# Patient Record
Sex: Male | Born: 1975 | Hispanic: Yes | Marital: Married | Smoking: Never smoker
Health system: Southern US, Community
[De-identification: ages and names within clinical notes are randomized; demographics above are authoritative.]

---

## 2009-12-15 ENCOUNTER — Emergency Department: Payer: Self-pay | Admitting: Emergency Medicine

## 2009-12-17 ENCOUNTER — Emergency Department: Payer: Self-pay | Admitting: Internal Medicine

## 2011-03-17 ENCOUNTER — Emergency Department: Payer: Self-pay | Admitting: Emergency Medicine

## 2014-12-08 ENCOUNTER — Inpatient Hospital Stay: Payer: Self-pay | Admitting: Internal Medicine

## 2015-02-27 NOTE — Discharge Summary (Signed)
PATIENT NAME:  Jason BushyMUNDO VALENZUELA, Awais MR#:  [id number] DATE OF BIRTH:  1976/10/20  DATE OF ADMISSION:  12/08/2014 DATE OF DISCHARGE:  12/09/2014  PRIMARY CARE PHYSICIAN:  At the [facility].   FINAL DIAGNOSES: 2. Acute respiratory failure, which resolved.  3. Bronchitis with asthmatic component.   MEDICATIONS ON DISCHARGE: Include prednisone taper 10 mg 3 tablets day 1, 2 tablets day 2, 1 tablet day 3, 1/2 tablet days 4 and 5. Zithromax 500 mg once a day for 3 days, Ceftin 500 mg 1 tablet twice a day for 9 days, Proventil HFA 1 puff 4 times a day as needed for shortness of breath.   DIET: Regular diet, regular consistency.   ACTIVITY: As tolerated.   HISTORY OF PRESENT ILLNESS: The patient 12/08/2014 and discharged 12/09/2014, came in with shortness of breath, cough, and fever, was admitted with hypoxia and bronchitis, started on Rocephin and Zithromax, and a prednisone taper.   LABORATORY AND RADIOLOGICAL DATA DURING  HOSPITAL COURSE: Included an initial chest x-ray that showed the possibility of bronchitis. Glucose 113, BUN 13, creatinine 1.15, sodium 138, potassium 3.7, chloride 102, CO2 of 29, alkaline phosphatase 120, other liver function tests normal range. White blood cell count 13.6, hemoglobin and hematocrit 15.8 and 47.8, platelet count of 287,000. Blood cultures negative. Lactic acid 1.5. Venous pH 7.35. Influenza was negative. Hemoglobin A1c 5.3. Creatinine upon discharge 0.95. White count upon discharge 11.9. Repeat chest x-ray, PA and lateral, showed chronic bronchitic changes with new segmental atelectasis of the left lung base. No pneumonia or CHF.   HOSPITAL COURSE PER PROBLEM LIST:  1. For the patient's acute respiratory failure: This had resolved. The patient had hypoxia on presentation with ambulation. I ambulated the patient, pulse oximetry remained above 90%.  2. Bronchitis with asthmatic component: The patient was on Rocephin and Zithromax, switched over to Zithromax  and Ceftin p.o., and a prednisone taper, also wrote a script for Proventil inhaler. Outpatient, will finish up the course of these medications. Stable for discharge home.   TIME SPENT ON DISCHARGE: 35 minutes.   Translator needed to communicate.   A work note written for discharge.    ____________________________ Herschell Dimesichard J. Renae GlossWieting, MD rjw:mw D: 12/09/2014 13:27:37 ET T: 12/09/2014 17:17:14 ET JOB#: [id number]  cc: Herschell Dimesichard J. Renae GlossWieting, MD, <Dictator> [facility] Salley ScarletICHARD J Soua Caltagirone MD ELECTRONICALLY SIGNED 12/10/2014 16:13

## 2015-02-27 NOTE — H&P (Signed)
PATIENT NAME:  Jason Mcdowell, Jason Mcdowell MR#:  [id number] DATE OF BIRTH:  09/30/1976  DATE OF ADMISSION:  12/08/2014  PRIMARY CARE PROVIDER: None.   EMERGENCY DEPARTMENT REFERRING PHYSICIAN:  [facility] CHIEF COMPLAINT: Shortness of breath, cough, fever.   HISTORY OF PRESENT ILLNESS: The patient is a 39 year old Spanish male who has no previous medical history, who states that he has been having a sore throat for the past 6 days and has been short of breath for the past 5 days. The patient came to the ED with these symptoms. He had a chest x-ray, which showed findings consistent with bronchitis. Whenever they try to ambulate him, his oxygen saturation would drop. Therefore, we were asked to admit the patient.   He denies any chest pains or palpitations. He has had a productive    cough, and has had fevers and chills. Denies any nausea, vomiting or diarrhea.   PAST MEDICAL HISTORY: None.   PAST SURGICAL HISTORY: None.   ALLERGIES: None.   MEDICATIONS: None.   SOCIAL HISTORY: Does not smoke. Drinks socially. No drug use.   FAMILY HISTORY: No history of lung disease.   REVIEW OF SYSTEMS:  CONSTITUTIONAL: Complains of fever, fatigue. No weight loss. No weight gain. EYES: No blurred or double vision. No redness. No inflammation. No glaucoma. No cataracts.  ENT: No tinnitus. No ear pain. No hearing loss. No seasonal or area or year-round allergies.  RESPIRATORY: Complains of cough. No wheezing. No hemoptysis. Complains of dyspnea. No COPD.  CARDIOVASCULAR: Complains of some substernal discomfort with coughing.  GASTROINTESTINAL: No nausea, vomiting, diarrhea. No abdominal pain. No hematemesis.  GENITOURINARY: Denies any dysuria, hematuria, renal colic or frequency.  ENDOCRINE: Denies any polyuria, nocturia, thyroid problems.  HEMATOLOGIC AND LYMPHATIC: Denies anemia, easy bruisability or bleeding.  SKIN: No acne. No rash.  MUSCULOSKELETAL: Denies any pain in the neck, back or shoulders.   NEUROLOGIC: No numbness, CVA, TIA or seizure.  PSYCHIATRIC: No anxiety, insomnia, or ADD.   PHYSICAL EXAMINATION:  VITAL SIGNS: Temperature 100.9, pulse 119, respirations 20, blood pressure 141/87.  GENERAL: The patient is a well-developed, well-nourished male in no acute distress.  HEENT: Head atraumatic, normocephalic. Pupils equally round, reactive to light and accommodation. There is no conjunctival pallor. No scleral icterus. Nasal exam shows no drainage or ulceration. Oropharynx is clear, without any exudate.  NECK: Supple, without any thyromegaly.  CARDIOVASCULAR: Regular rate and rhythm. No murmurs, rubs, clicks, or gallops.  LUNGS: Occasional rhonchi and occasional wheezing. No accessory muscle usage.  ABDOMEN: Soft, nontender, nondistended. Positive bowel sounds x 4.  EXTREMITIES: No clubbing, cyanosis, or edema.  SKIN: No rash.  LYMPH NODES: Nonpalpable.  MUSCULOSKELETAL: There is no erythema or swelling.  VASCULAR: Good DP and PT pulses.  PSYCHIATRIC: Not anxious or depressed.  NEUROLOGIC: Awake, alert, and oriented x 3. No focal deficits.  LYMPH NODES: Nonpalpable.  VASCULAR: Good DP and PT pulses.  PSYCHIATRIC: Not anxious or depressed.   LABORATORY EVALUATIONS: Glucose 113, BUN 13, creatinine 1.15, sodium 138, potassium 3.7, chloride 102, CO2 of 28, calcium 8.9. LFTs are normal, except alkaline phosphatase of 120. WBC 13.6, hemoglobin 15.8, platelet count 287,000. Influenza A is negative. Chest x-ray shows acute bronchitic changes. No other abnormalities.   ASSESSMENT AND PLAN: The patient is a 39 year old Spanish male who presents with cough, shortness of breath, hypoxia, and fever.  1.  Shortness of breath, cough. hypoxia and fever likely due to acute bronchitis; however, pneumonia is a possibility as well. At this time,  we will give him IV fluids. Repeat a PA and lateral chest x-ray in the morning, and treat him with IV ceftriaxone and azithromycin. We will also place him  on a prednisone taper and some nebulizers in light of him having some wheezing, possibly due to bronchospasm.  2.  Leukocytosis likely due to infection as stated above.  3.  Miscellaneous. Will use on Lovenox for deep vein thrombosis prophylaxis.   TIME SPENT: 35 minutes on this patient.    ____________________________ Lacie Scotts. Allena Katz, MD shp:MT D: 12/08/2014 15:46:22 ET T: 12/08/2014 16:06:29 ET JOB#: [id number]  cc: Aristide Waggle H. Allena Katz, MD, <Dictator> Charise Carwin MD ELECTRONICALLY SIGNED 12/17/2014 15:52

## 2018-10-27 ENCOUNTER — Emergency Department
Admission: EM | Admit: 2018-10-27 | Discharge: 2018-10-27 | Disposition: A | Discharge status: Home / Self Care | Payer: Self-pay | Attending: Emergency Medicine | Admitting: Emergency Medicine

## 2018-10-27 ENCOUNTER — Encounter: Payer: Self-pay | Admitting: Emergency Medicine

## 2018-10-27 ENCOUNTER — Other Ambulatory Visit: Payer: Self-pay

## 2018-10-27 DIAGNOSIS — K1120 Sialoadenitis, unspecified: Secondary | ICD-10-CM | POA: Insufficient documentation

## 2018-10-27 DIAGNOSIS — H9192 Unspecified hearing loss, left ear: Secondary | ICD-10-CM | POA: Insufficient documentation

## 2018-10-27 MED ORDER — PREDNISONE 10 MG (21) PO TBPK: ORAL_TABLET | ORAL | 0 refills | Status: AC

## 2018-10-27 MED ORDER — AMOXICILLIN 500 MG PO CAPS: 500.0000 mg | ORAL_CAPSULE | Freq: Three times a day (TID) | ORAL | 0 refills | Status: AC

## 2018-10-27 NOTE — ED Provider Notes (Signed)
[facility] Emergency Department Provider Note  ____________________________________________   First MD Initiated Contact with Patient 10/27/18 1131     (approximate)  I have reviewed the triage vital signs and the nursing notes.   HISTORY  Chief Complaint Otalgia    HPI Jason Mcdowell is a 42 y.o. male presents emergency department complaining of swelling at the left ear for 1 month.  He states that the area has caused some pain but noted difficulty with his hearing.  Does not increase with eating.  He is unsure of his MMRs.  He states when it began to swell he did have fever chills.    History reviewed. No pertinent past medical history.  There are no active problems to display for this patient.   History reviewed. No pertinent surgical history.  Prior to Admission medications   Medication Sig Start Date End Date Taking? Authorizing Provider  amoxicillin (AMOXIL) 500 MG capsule Take 1 capsule (500 mg total) by mouth 3 (three) times daily. 10/27/18   , Roselyn Bering W, PA-C  predniSONE (STERAPRED UNI-PAK 21 TAB) 10 MG (21) TBPK tablet Take 6 pills on day one then decrease by 1 pill each day 10/27/18   Faythe Ghee,  W, PA-C    Allergies Patient has no allergy information on record.  No family history on file.  Social History Social History   Tobacco Use  . Smoking status: Never Smoker  . Smokeless tobacco: Never Used  Substance Use Topics  . Alcohol use: Not Currently  . Drug use: Not Currently    Review of Systems  Constitutional: No fever/chills Eyes: No visual changes. ENT: No sore throat.  Positive for swelling of the left side of the face Respiratory: Denies cough Genitourinary: Negative for dysuria. Musculoskeletal: Negative for back pain. Skin: Negative for rash.    ____________________________________________   PHYSICAL EXAM:  VITAL SIGNS: ED Triage Vitals  Enc Vitals Group     BP 10/27/18 1109 119/73   Pulse Rate 10/27/18 1109 73     Resp 10/27/18 1109 20     Temp 10/27/18 1109 98.2 F (36.8 C)     Temp Source 10/27/18 1109 Oral     SpO2 10/27/18 1109 98 %     Weight 10/27/18 1110 200 lb (90.7 kg)     Height 10/27/18 1110 6' (1.829 m)     Head Circumference --      Peak Flow --      Pain Score 10/27/18 1110 8     Pain Loc --      Pain Edu? --      Excl. in GC? --     Constitutional: Alert and oriented. Well appearing and in no acute distress. Eyes: Conjunctivae are normal.  Head: Atraumatic.  Positive for parotid gland swelling on the left side Ears: TMs are clear bilaterally Nose: No congestion/rhinnorhea. Mouth/Throat: Mucous membranes are moist.   Neck:  supple no lymphadenopathy noted Cardiovascular: Normal rate, regular rhythm. Heart sounds are normal Respiratory: Normal respiratory effort.  No retractions, lungs c t a  GU: deferred Musculoskeletal: FROM all extremities, warm and well perfused Neurologic:  Normal speech and language.  Skin:  Skin is warm, dry and intact. No rash noted. Psychiatric: Mood and affect are normal. Speech and behavior are normal.  ____________________________________________   LABS (all labs ordered are listed, but only abnormal results are displayed)  Labs Reviewed  MUMPS ANTIBODY, IGG  MUMPS ANTIBODY, IGM   ____________________________________________   ____________________________________________  RADIOLOGY  ____________________________________________   PROCEDURES  Procedure(s) performed: No  Procedures    ____________________________________________   INITIAL IMPRESSION / ASSESSMENT AND PLAN / ED COURSE  Pertinent labs & imaging results that were available during my care of the patient were reviewed by me and considered in my medical decision making (see chart for details).   Patient presents emergency department with swelling at the left ear  Physical exam shows parotid gland swelling.  Symptoms have  been ongoing for a month so the mumps IgG and IgM test were ordered.  Explained to the patient that these test would not be back for a few days.  Since he has been symptomatic for a month quarantine time would already be over.  He was given a prescription for amoxicillin and Sterapred.  He is to return emergency department worsening.  He states he understands will comply.  He was discharged in stable condition.     As part of my medical decision making, I reviewed the following data within the electronic MEDICAL RECORD NUMBER Nursing notes reviewed and incorporated, Interpreter needed, Notes from prior ED visits and [place] Controlled Substance Database  ____________________________________________   FINAL CLINICAL IMPRESSION(S) / ED DIAGNOSES  Final diagnoses:  Parotitis      NEW MEDICATIONS STARTED DURING THIS VISIT:  Discharge Medication List as of 10/27/2018 12:16 PM    START taking these medications   Details  amoxicillin (AMOXIL) 500 MG capsule Take 1 capsule (500 mg total) by mouth 3 (three) times daily., Starting Mon 10/27/2018, Normal    predniSONE (STERAPRED UNI-PAK 21 TAB) 10 MG (21) TBPK tablet Take 6 pills on day one then decrease by 1 pill each day, Normal         Note:  This document was prepared using Dragon voice recognition software and may include unintentional dictation errors.    Faythe GheeFisher,  W, PA-C 10/27/18 1301    Arnaldo NatalMalinda, Paul F, MD 10/27/18 [phone]

## 2018-10-27 NOTE — ED Triage Notes (Signed)
Left sided ear pain X greater than 4 weeks, here because of worsening of pain.  States he also has noticed some drainage.  Denies recent URI.

## 2018-10-27 NOTE — ED Notes (Signed)
Pt c/o knot behind left ear that has been there for about a month. Pt states recently the growth has caused ear pain and slight drainage. Pt in NAD at this time. This RN will continue to monitor.

## 2018-10-29 LAB — MUMPS ANTIBODY, IGG: MUMPS IGG: 216 [AU]/ml (ref >10.9)

## 2018-10-30 LAB — MUMPS ANTIBODY, IGM

## 2019-04-21 ENCOUNTER — Telehealth: Payer: Self-pay | Admitting: Internal Medicine

## 2019-04-21 DIAGNOSIS — Z20822 Contact with and (suspected) exposure to covid-19: Secondary | ICD-10-CM

## 2019-04-21 NOTE — Telephone Encounter (Signed)
Glenda from [facility] wants pt to be scheduled for testing due to covid sx cough and chest pain, and exposure to covid + Appt scheduled for tomorrow 0800 at [facility]. Address provided, pt asked to stay in car and to wear a mask to the testing site. Pt informed results will be available in 48 to 72 hrs.

## 2019-04-22 ENCOUNTER — Other Ambulatory Visit: Payer: Self-pay

## 2019-04-22 DIAGNOSIS — Z20822 Contact with and (suspected) exposure to covid-19: Secondary | ICD-10-CM

## 2019-04-26 LAB — NOVEL CORONAVIRUS, NAA: SARS-CoV-2, NAA: NOT DETECTED

## 2019-04-27 ENCOUNTER — Telehealth: Payer: Self-pay

## 2019-04-27 NOTE — Telephone Encounter (Signed)
Wife given COVID 19 results. Verbalizes understanding.

## 2020-12-14 ENCOUNTER — Other Ambulatory Visit: Payer: Self-pay

## 2020-12-14 ENCOUNTER — Emergency Department
Admission: EM | Admit: 2020-12-14 | Discharge: 2020-12-14 | Disposition: A | Discharge status: Home / Self Care | Payer: Self-pay | Attending: Emergency Medicine | Admitting: Emergency Medicine

## 2020-12-14 ENCOUNTER — Emergency Department: Payer: Self-pay

## 2020-12-14 DIAGNOSIS — Z20822 Contact with and (suspected) exposure to covid-19: Secondary | ICD-10-CM | POA: Insufficient documentation

## 2020-12-14 DIAGNOSIS — T466X5A Adverse effect of antihyperlipidemic and antiarteriosclerotic drugs, initial encounter: Secondary | ICD-10-CM

## 2020-12-14 DIAGNOSIS — R251 Tremor, unspecified: Secondary | ICD-10-CM | POA: Insufficient documentation

## 2020-12-14 DIAGNOSIS — M791 Myalgia, unspecified site: Secondary | ICD-10-CM | POA: Insufficient documentation

## 2020-12-14 DIAGNOSIS — R6883 Chills (without fever): Secondary | ICD-10-CM | POA: Insufficient documentation

## 2020-12-14 LAB — COMPREHENSIVE METABOLIC PANEL
ALT: 30 U/L (ref 0–44)
AST: 24 U/L (ref 15–41)
Albumin: 4.4 g/dL (ref 3.5–5.0)
Alkaline Phosphatase: 64 U/L (ref 38–126)
Anion gap: 6 (ref 5–15)
BUN: 14 mg/dL (ref 6–20)
CO2: 30 mmol/L (ref 22–32)
Calcium: 8.9 mg/dL (ref 8.9–10.3)
Chloride: 103 mmol/L (ref 98–111)
Creatinine, Ser: 0.99 mg/dL (ref 0.61–1.24)
GFR, Estimated: >60 mL/min (ref >60)
Glucose, Bld: 94 mg/dL (ref 70–99)
Potassium: 3.5 mmol/L (ref 3.5–5.1)
Sodium: 139 mmol/L (ref 135–145)
Total Bilirubin: 1.2 mg/dL (ref 0.3–1.2)
Total Protein: 6.7 g/dL (ref 6.5–8.1)

## 2020-12-14 LAB — URINALYSIS, COMPLETE (UACMP) WITH MICROSCOPIC
Bacteria, UA: NONE SEEN
Bilirubin Urine: NEGATIVE
Glucose, UA: NEGATIVE mg/dL
Hgb urine dipstick: NEGATIVE
Ketones, ur: 5 mg/dL — AB
Leukocytes,Ua: NEGATIVE
Nitrite: NEGATIVE
Protein, ur: NEGATIVE mg/dL
Specific Gravity, Urine: 1.013 (ref 1.005–1.030)
Squamous Epithelial / HPF: NONE SEEN (ref 0–5)
pH: 5 (ref 5.0–8.0)

## 2020-12-14 LAB — TROPONIN I (HIGH SENSITIVITY)
Troponin I (High Sensitivity): 4 ng/L (ref <18)
Troponin I (High Sensitivity): 4 ng/L (ref <18)

## 2020-12-14 LAB — CBC WITH DIFFERENTIAL/PLATELET
Abs Immature Granulocytes: 0.01 10*3/uL (ref 0.00–0.07)
Basophils Absolute: 0 10*3/uL (ref 0.0–0.1)
Basophils Relative: 1 %
Eosinophils Absolute: 0.2 10*3/uL (ref 0.0–0.5)
Eosinophils Relative: 3 %
HCT: 44.9 % (ref 39.0–52.0)
Hemoglobin: 15.3 g/dL (ref 13.0–17.0)
Immature Granulocytes: 0 %
Lymphocytes Relative: 31 %
Lymphs Abs: 2 10*3/uL (ref 0.7–4.0)
MCH: 30.9 pg (ref 26.0–34.0)
MCHC: 34.1 g/dL (ref 30.0–36.0)
MCV: 90.7 fL (ref 80.0–100.0)
Monocytes Absolute: 0.6 10*3/uL (ref 0.1–1.0)
Monocytes Relative: 9 %
Neutro Abs: 3.7 10*3/uL (ref 1.7–7.7)
Neutrophils Relative %: 56 %
Platelets: 237 10*3/uL (ref 150–400)
RBC: 4.95 MIL/uL (ref 4.22–5.81)
RDW: 11.5 % (ref 11.5–15.5)
WBC: 6.4 10*3/uL (ref 4.0–10.5)
nRBC: 0 % (ref 0.0–0.2)

## 2020-12-14 LAB — POC SARS CORONAVIRUS 2 AG -  ED: SARS Coronavirus 2 Ag: NEGATIVE

## 2020-12-14 NOTE — ED Notes (Signed)
Pt ambulated unassisted from bed to bathroom in room 6 to provide UA.

## 2020-12-14 NOTE — ED Notes (Signed)
Dr. Fanny Bien at bedside with interpreter updating pt.

## 2020-12-14 NOTE — ED Provider Notes (Signed)
[facility] Emergency Department Provider Note   ____________________________________________   Event Date/Time   First MD Initiated Contact with Patient 12/14/20 0411     (approximate)  I have reviewed the triage vital signs and the nursing notes.   HISTORY  Chief Complaint Chills and Dizziness  Spanish video interpreter Kern Alberta  HPI Jason Mcdowell is a 45 y.o. male comes in tonight as he started experience shaking chills at 3 AM   Patient reports he felt peripherally fine when he went to bed no recent illnesses.  Except he does report that since starting a new medicine has had a little bit of achiness in his muscles, started atorvastatin about 2 weeks ago.  Went to bed feeling well, woke up at 3 in the morning with muscle aches, some shaking chills, and then a little bit of shortness of breath without a cough  No chest pain.  No pain anywhere other than just slight muscle aches been ongoing for a couple weeks.  No fevers.  Feels like he was having shaking chills that of improved now.  He felt a little short of breath but that seems to be improving, he also felt a little bit tingly in both hands which is now resolved.  No nausea vomiting.  No headache no neck pain.  No chest pain.  Otherwise reports well, just not sure why he woke up feeling like he was having chills and shakes.  Is resolved now and does not feel short of breath any longer  History reviewed. No pertinent past medical history.  There are no problems to display for this patient.   History reviewed. No pertinent surgical history.  Prior to Admission medications   Medication Sig Start Date End Date Taking? Authorizing Provider  amoxicillin (AMOXIL) 500 MG capsule Take 1 capsule (500 mg total) by mouth 3 (three) times daily. 10/27/18   Fisher, Roselyn Bering, PA-C  predniSONE (STERAPRED UNI-PAK 21 TAB) 10 MG (21) TBPK tablet Take 6 pills on day one then decrease by 1 pill each day  10/27/18   Faythe Ghee, PA-C    Allergies Patient has no allergy information on record.  No family history on file.  Social History Social History   Tobacco Use  . Smoking status: Never Smoker  . Smokeless tobacco: Never Used  Vaping Use  . Vaping Use: Never used  Substance Use Topics  . Alcohol use: Not Currently  . Drug use: Not Currently    Review of Systems Constitutional: No fever but having chills since 3 AM Eyes: No visual changes. ENT: No sore throat. Cardiovascular: Denies chest pain. Respiratory: See HPI felt a little short of breath earlier was much worse at 3 AM. Gastrointestinal: No abdominal pain.   Genitourinary: Negative for dysuria. Musculoskeletal: Negative for back pain. Skin: Negative for rash. Neurological: Negative for headaches, areas of focal weakness or numbness.  Denies rash.  No tick bites or insect bites.  ____________________________________________   PHYSICAL EXAM:  VITAL SIGNS: ED Triage Vitals  Enc Vitals Group     BP 12/14/20 0355 (!) 161/96     Pulse Rate 12/14/20 0355 86     Resp 12/14/20 0355 18     Temp 12/14/20 0355 98 F (36.7 C)     Temp Source 12/14/20 0355 Oral     SpO2 12/14/20 0355 100 %     Weight 12/14/20 0359 197 lb (89.4 kg)     Height 12/14/20 0359 6' (1.829 m)  Head Circumference --      Peak Flow --      Pain Score 12/14/20 0358 0     Pain Loc --      Pain Edu? --      Excl. in GC? --     Constitutional: Alert and oriented. Well appearing and in no acute distress. Eyes: Conjunctivae are normal. Head: Atraumatic. Nose: No congestion/rhinnorhea. Mouth/Throat: Mucous membranes are moist. Neck: No stridor.  Cardiovascular: Normal rate, regular rhythm. Grossly normal heart sounds.  Good peripheral circulation. Respiratory: Normal respiratory effort.  No retractions. Lungs CTAB. Gastrointestinal: Soft and nontender. No distention. Musculoskeletal: No lower extremity tenderness nor  edema. Neurologic:  Normal speech and language. No gross focal neurologic deficits are appreciated.  Skin:  Skin is warm, dry and intact. No rash noted. Psychiatric: Mood and affect are normal. Speech and behavior are normal.  Overall the patient is a very normal examination at this time.  Normal work of breathing, no cough.  Fully alert.  Very reassuring exam.   ____________________________________________   LABS (all labs ordered are listed, but only abnormal results are displayed)  Labs Reviewed  URINALYSIS, COMPLETE (UACMP) WITH MICROSCOPIC - Abnormal; Notable for the following components:      Result Value   Color, Urine YELLOW (*)    APPearance CLEAR (*)    Ketones, ur 5 (*)    All other components within normal limits  SARS CORONAVIRUS 2 (TAT 6-24 HRS)  CBC WITH DIFFERENTIAL/PLATELET  COMPREHENSIVE METABOLIC PANEL  POC SARS CORONAVIRUS 2 AG -  ED  TROPONIN I (HIGH SENSITIVITY)  TROPONIN I (HIGH SENSITIVITY)   ____________________________________________  EKG  Reviewed inter by me at 410 Heart rate 65 QRS 110 QTc 400 Normal sinus rhythm, incomplete right bundle branch block.  No evidence of acute ischemia.  Some slight favored to be baseline artifact noted in V4, I do not believe this represents acute ST elevation MI. ____________________________________________  RADIOLOGY  DG Chest 2 View  Result Date: 12/14/2020 CLINICAL DATA:  Shortness of breath. EXAM: CHEST - 2 VIEW COMPARISON:  12/09/2014. FINDINGS: Mediastinum hilar structures normal. Heart size normal. Low lung volumes. No focal infiltrate. No pleural effusion or pneumothorax. No acute bony abnormality. IMPRESSION: No acute cardiopulmonary disease. Electronically Signed   By: Maisie Fus  Register   On: 12/14/2020 05:19    Chest x-ray reviewed negative for acute findings, personally viewed by me ____________________________________________   PROCEDURES  Procedure(s) performed: None  Procedures  Critical  Care performed: No  ____________________________________________   INITIAL IMPRESSION / ASSESSMENT AND PLAN / ED COURSE  Pertinent labs & imaging results that were available during my care of the patient were reviewed by me and considered in my medical decision making (see chart for details).   Patient presents for rather abrupt onset during sleep of chills accompanied by a feeling of shortness of breath that has since resolved as well as tingling in his hands it is since resolved.  Etiology is somewhat unclear, he has reported muscle aches since starting atorvastatin and I have recommended he discontinue this, and at this point his vital signs reassuring slightly hypertensive.  Very reassuring nontoxic exam.  Given his presentation and what could be early rigors, will perform infectious work-up.  He is afebrile now.  Additionally his EKG shows likely some slight repolarization type abnormality, and I do not suspect ACS highly as he has no chest pain.  But I will send a troponin to further evaluate.  Overall very reassuring exam  at this time.  Chest x-ray urinalysis, basic labs ordered  Clinical Course as of 12/14/20 0656  Wed Dec 14, 2020  0177 Patient resting comfortably, to this point all labs that are not pending have returned normal. [MQ]  [phone] Patient work-up very reassuring.  Essentially normal urinalysis metabolic panel troponin negative Covid.  Normal CBC.  Patient resting comfortably without distress.  Appears improved, exact etiology of his symptoms tonight are not clear but I see no evidence of acute bacterial infection and he appears very nontoxic.  Await second troponin, if this is normal anticipate discharge home with careful return precautions and he will discontinue use of his statin. [MQ]  [phone] Results and plan reviewed with the patient via Spanish interpreter. [MQ]    Clinical Course User Index [MQ] Sharyn Creamer, MD   Trop #2 within normal  limits  ____________________________________________   FINAL CLINICAL IMPRESSION(S) / ED DIAGNOSES  Final diagnoses:  Myalgia due to statin  Chills        Note:  This document was prepared using Dragon voice recognition software and may include unintentional dictation errors       Sharyn Creamer, MD 12/14/20 [phone]

## 2020-12-14 NOTE — Discharge Instructions (Addendum)
STOP use of atorvastatin (this may be causing your muscle aches), and speak to your prescribing doctor about alternatives.

## 2020-12-14 NOTE — ED Triage Notes (Signed)
Video interpreter [phone]  Pt states he started having chills about half an hour ago with shaking. No tremors noted.  Reports started taking atorvastatin and asa for HTN 2 weeks ago and was unsure if it was rxn to meds.   Pt endorses dizziness at this time

## 2022-09-29 IMAGING — CR DG CHEST 2V
2 series · 2 of 2 positions shown · non-contrast
Comparison: 12/09/2014.

CLINICAL DATA: Shortness of breath.

EXAM:
CHEST - 2 VIEW

[chest pa]
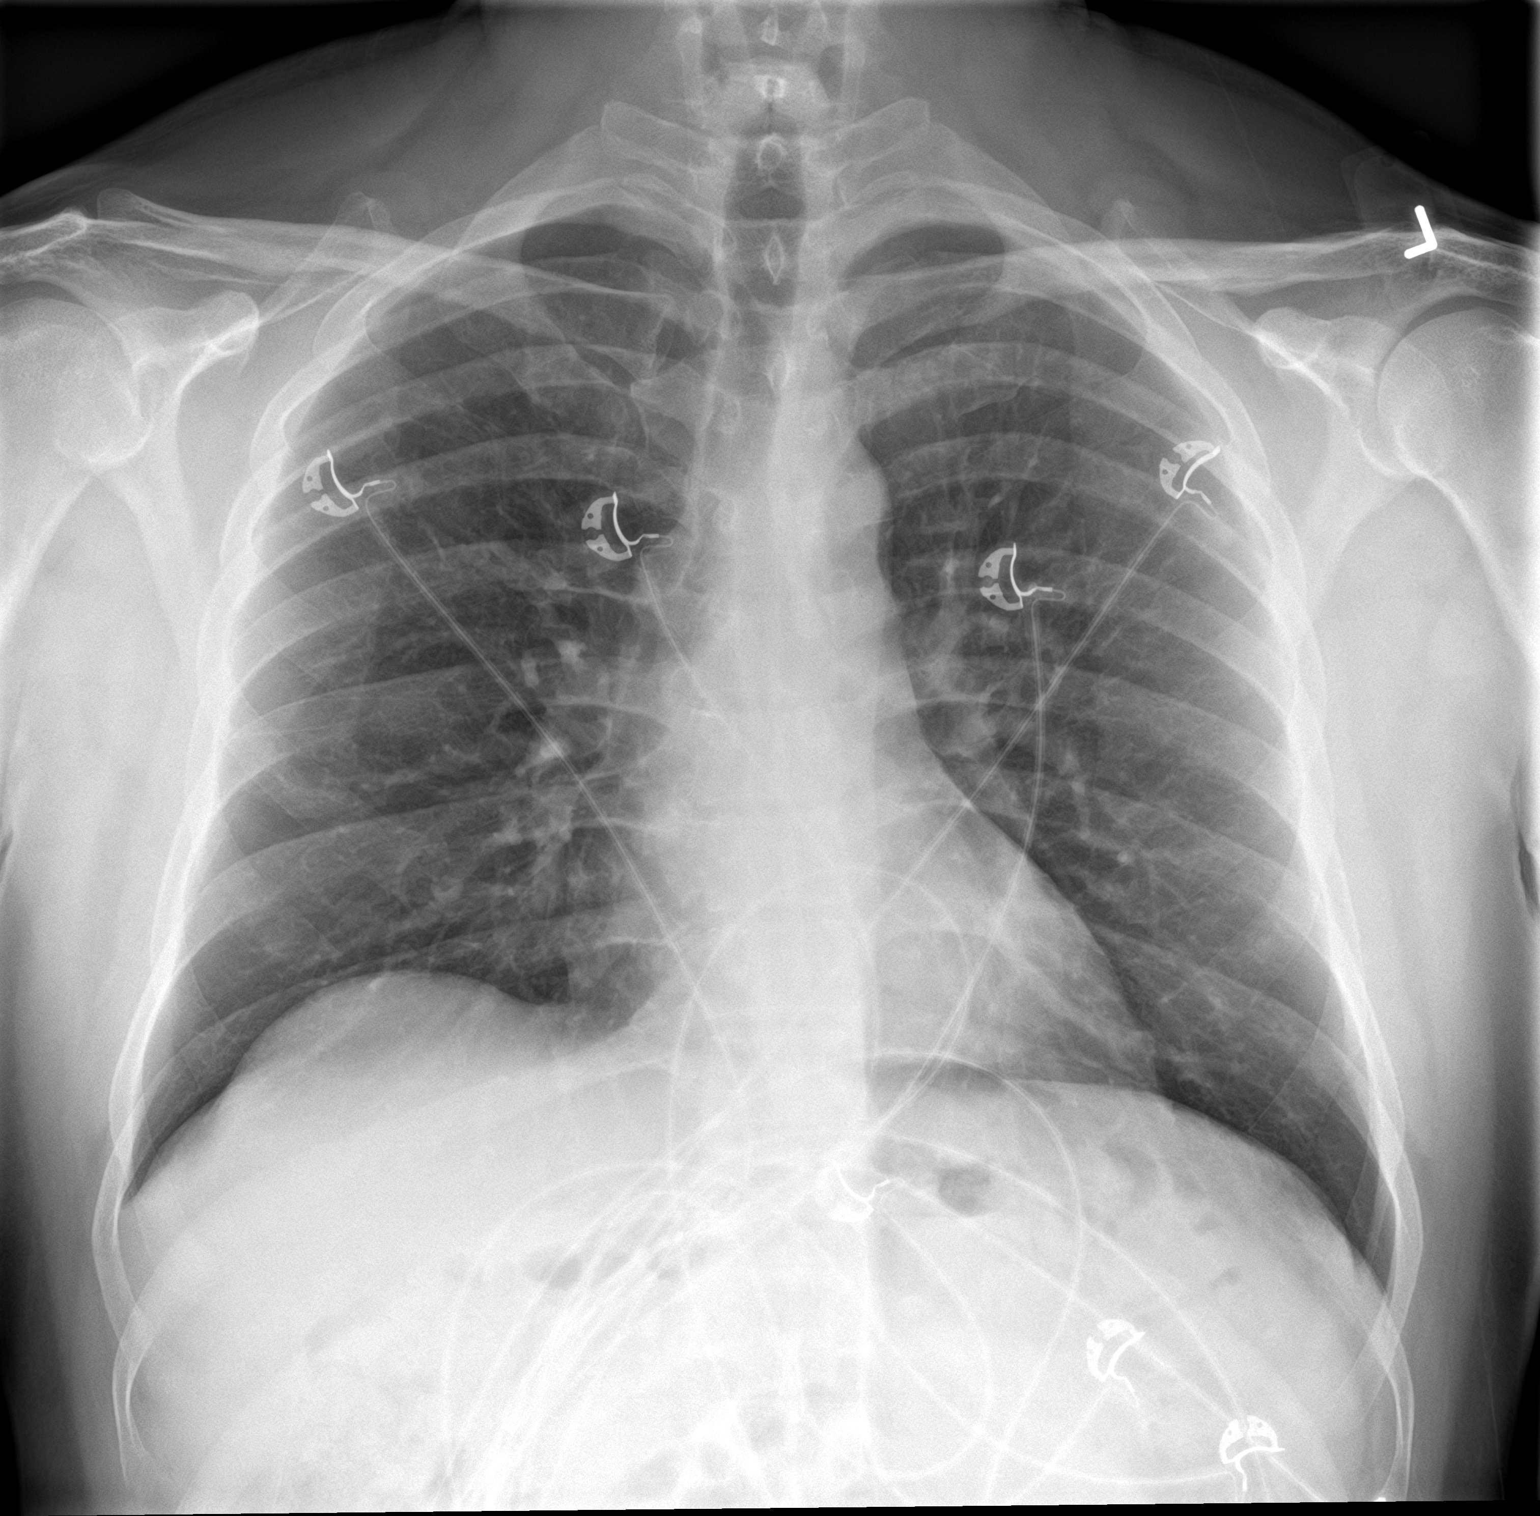

[chest lat]
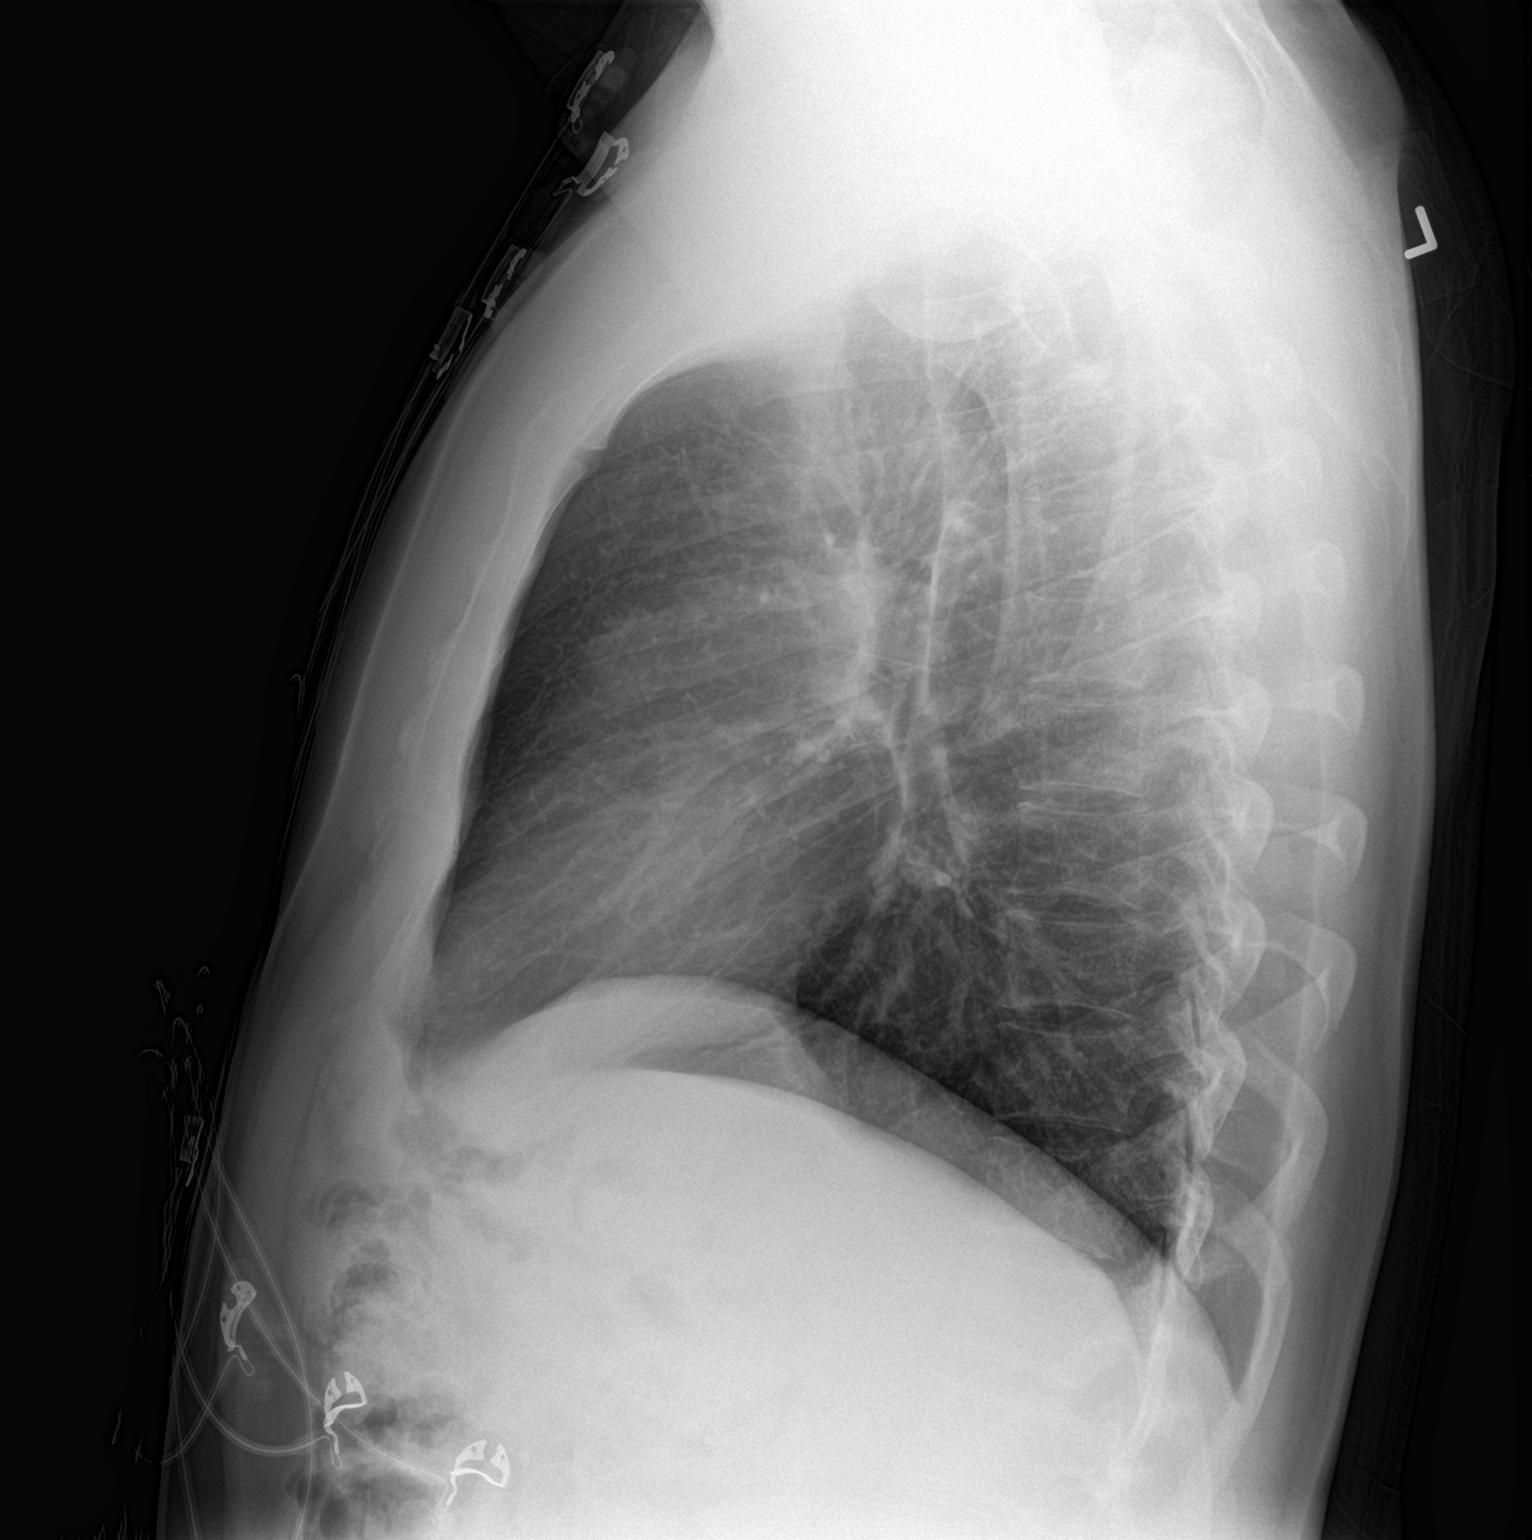

[2 of 2 positions shown; findings below may reference images not displayed]

FINDINGS: Mediastinum hilar structures normal. Heart size normal. Low lung
volumes. No focal infiltrate. No pleural effusion or pneumothorax.
No acute bony abnormality.
IMPRESSION: No acute cardiopulmonary disease.
